# Patient Record
Sex: Male | Born: 1970 | Race: Black or African American | Hispanic: No | Marital: Single | Smoking: Former smoker
Health system: Southern US, Community
[De-identification: ages and names within clinical notes are randomized; demographics above are authoritative.]

## PROBLEM LIST (undated history)

## (undated) DIAGNOSIS — I219 Acute myocardial infarction, unspecified: Secondary | ICD-10-CM

## (undated) DIAGNOSIS — I10 Essential (primary) hypertension: Secondary | ICD-10-CM

---

## 2020-02-16 ENCOUNTER — Emergency Department (HOSPITAL_COMMUNITY)
Admission: EM | Admit: 2020-02-16 | Discharge: 2020-02-16 | Disposition: A | Discharge status: Home / Self Care | Attending: Emergency Medicine | Admitting: Emergency Medicine

## 2020-02-16 ENCOUNTER — Other Ambulatory Visit: Payer: Self-pay

## 2020-02-16 ENCOUNTER — Emergency Department (HOSPITAL_COMMUNITY)

## 2020-02-16 ENCOUNTER — Encounter (HOSPITAL_COMMUNITY): Payer: Self-pay | Admitting: Emergency Medicine

## 2020-02-16 DIAGNOSIS — S0502XA Injury of conjunctiva and corneal abrasion without foreign body, left eye, initial encounter: Secondary | ICD-10-CM | POA: Diagnosis not present

## 2020-02-16 DIAGNOSIS — S299XXA Unspecified injury of thorax, initial encounter: Secondary | ICD-10-CM | POA: Insufficient documentation

## 2020-02-16 DIAGNOSIS — T1490XA Injury, unspecified, initial encounter: Secondary | ICD-10-CM

## 2020-02-16 DIAGNOSIS — Y92149 Unspecified place in prison as the place of occurrence of the external cause: Secondary | ICD-10-CM | POA: Diagnosis not present

## 2020-02-16 DIAGNOSIS — I10 Essential (primary) hypertension: Secondary | ICD-10-CM | POA: Diagnosis not present

## 2020-02-16 DIAGNOSIS — S0501XA Injury of conjunctiva and corneal abrasion without foreign body, right eye, initial encounter: Secondary | ICD-10-CM | POA: Insufficient documentation

## 2020-02-16 DIAGNOSIS — S0990XA Unspecified injury of head, initial encounter: Secondary | ICD-10-CM | POA: Diagnosis present

## 2020-02-16 DIAGNOSIS — S060X9A Concussion with loss of consciousness of unspecified duration, initial encounter: Secondary | ICD-10-CM | POA: Insufficient documentation

## 2020-02-16 DIAGNOSIS — Z87891 Personal history of nicotine dependence: Secondary | ICD-10-CM | POA: Insufficient documentation

## 2020-02-16 HISTORY — DX: Essential (primary) hypertension: I10

## 2020-02-16 HISTORY — DX: Acute myocardial infarction, unspecified: I21.9

## 2020-02-16 MED ORDER — IBUPROFEN 400 MG PO TABS: 400.0000 mg | ORAL_TABLET | Freq: Four times a day (QID) | ORAL | 0 refills | Status: AC | PRN

## 2020-02-16 MED ORDER — ERYTHROMYCIN 5 MG/GM OP OINT
TOPICAL_OINTMENT | Freq: Once | OPHTHALMIC | Status: AC
  Administered 2020-02-16: 1 via OPHTHALMIC
  Filled 2020-02-16: qty 3.5

## 2020-02-16 MED ORDER — TETRACAINE HCL 0.5 % OP SOLN
2.0000 [drp] | Freq: Once | OPHTHALMIC | Status: AC
  Administered 2020-02-16: 2 [drp] via OPHTHALMIC
  Filled 2020-02-16: qty 4

## 2020-02-16 MED ORDER — FLUORESCEIN SODIUM 1 MG OP STRP
1.0000 | ORAL_STRIP | Freq: Once | OPHTHALMIC | Status: AC
  Administered 2020-02-16: 1 via OPHTHALMIC
  Filled 2020-02-16: qty 1

## 2020-02-16 MED ORDER — ERYTHROMYCIN 5 MG/GM OP OINT: TOPICAL_OINTMENT | Freq: Four times a day (QID) | OPHTHALMIC | 0 refills | Status: AC

## 2020-02-16 MED ORDER — IBUPROFEN 400 MG PO TABS
600.0000 mg | ORAL_TABLET | Freq: Once | ORAL | Status: AC
  Administered 2020-02-16: 600 mg via ORAL
  Filled 2020-02-16: qty 2

## 2020-02-16 NOTE — ED Notes (Addendum)
Pt ambulatory to waiting room with prison staff. Pt verbalized understanding of discharge instructions.
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
## 2020-08-23 ENCOUNTER — Inpatient Hospital Stay
Admit: 2020-08-23 | Discharge: 2020-08-23 | Disposition: A | Discharge status: Home / Self Care | Attending: Emergency Medicine

## 2020-08-23 ENCOUNTER — Emergency Department: Admit: 2020-08-23

## 2020-08-23 DIAGNOSIS — M5136 Other intervertebral disc degeneration, lumbar region: Secondary | ICD-10-CM

## 2020-08-23 MED ORDER — METHOCARBAMOL 500 MG PO TABS
500 MG | ORAL_TABLET | Freq: Three times a day (TID) | ORAL | 0 refills | Status: AC
Start: 2020-08-23 — End: 2020-08-30

## 2020-08-23 MED ORDER — METHYLPREDNISOLONE 4 MG PO TBPK
4 MG | PACK | ORAL | 0 refills | Status: AC
Start: 2020-08-23 — End: 2020-08-29
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
## 2020-08-23 NOTE — ED Notes (Signed)
I have reviewed discharge instructions with the patient.  The patient verbalized understanding.    Patient left ED via Discharge Method: ambulatory to Home with self    Opportunity for questions and clarification provided.       Patient given 2 scripts.         To continue your aftercare when you leave the hospital, you may receive an automated call from our care team to check in on how you are doing.  This is a free service and part of our promise to provide the best care and service to meet your aftercare needs.??? If you have questions, or wish to unsubscribe from this service please call [phone].  Thank you for Choosing our [facility] Emergency Department.        Providence Crosby, RN  08/23/20 1800

## 2020-08-23 NOTE — Discharge Instructions (Signed)
Take medication as prescribed for back pain and muscle spasm.     Follow-up with your PCP in 1 to 2 days if no improvement.  Return to the ER for any new or worsening symptoms.
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
